# Patient Record
Sex: Male | Born: 1980 | Race: Black or African American | Hispanic: No | Marital: Single | Smoking: Never smoker
Health system: Southern US, Community
[De-identification: ages and names within clinical notes are randomized; demographics above are authoritative.]

## PROBLEM LIST (undated history)

## (undated) HISTORY — PX: NO PAST SURGERIES: SHX2092

---

## 2021-09-15 ENCOUNTER — Encounter (HOSPITAL_COMMUNITY): Payer: Self-pay | Admitting: Emergency Medicine

## 2021-09-15 ENCOUNTER — Emergency Department (HOSPITAL_COMMUNITY)
Admission: EM | Admit: 2021-09-15 | Discharge: 2021-09-15 | Disposition: A | Discharge status: Home / Self Care | Payer: Self-pay | Attending: Emergency Medicine | Admitting: Emergency Medicine

## 2021-09-15 ENCOUNTER — Emergency Department (HOSPITAL_COMMUNITY): Payer: Self-pay

## 2021-09-15 ENCOUNTER — Other Ambulatory Visit: Payer: Self-pay

## 2021-09-15 DIAGNOSIS — K649 Unspecified hemorrhoids: Secondary | ICD-10-CM | POA: Insufficient documentation

## 2021-09-15 DIAGNOSIS — K644 Residual hemorrhoidal skin tags: Secondary | ICD-10-CM

## 2021-09-15 DIAGNOSIS — D696 Thrombocytopenia, unspecified: Secondary | ICD-10-CM

## 2021-09-15 DIAGNOSIS — R7989 Other specified abnormal findings of blood chemistry: Secondary | ICD-10-CM

## 2021-09-15 DIAGNOSIS — R7401 Elevation of levels of liver transaminase levels: Secondary | ICD-10-CM | POA: Insufficient documentation

## 2021-09-15 DIAGNOSIS — K7030 Alcoholic cirrhosis of liver without ascites: Secondary | ICD-10-CM | POA: Insufficient documentation

## 2021-09-15 DIAGNOSIS — K7460 Unspecified cirrhosis of liver: Secondary | ICD-10-CM

## 2021-09-15 DIAGNOSIS — R109 Unspecified abdominal pain: Secondary | ICD-10-CM

## 2021-09-15 DIAGNOSIS — Z20822 Contact with and (suspected) exposure to covid-19: Secondary | ICD-10-CM | POA: Insufficient documentation

## 2021-09-15 LAB — ABO/RH: ABO/RH(D): O POS

## 2021-09-15 LAB — COMPREHENSIVE METABOLIC PANEL
ALT: 371 U/L — ABNORMAL HIGH (ref 0–44)
AST: 317 U/L — ABNORMAL HIGH (ref 15–41)
Albumin: 3.7 g/dL (ref 3.5–5.0)
Alkaline Phosphatase: 133 U/L — ABNORMAL HIGH (ref 38–126)
Anion gap: 6 (ref 5–15)
BUN: 12 mg/dL (ref 6–20)
CO2: 27 mmol/L (ref 22–32)
Calcium: 9.3 mg/dL (ref 8.9–10.3)
Chloride: 103 mmol/L (ref 98–111)
Creatinine, Ser: 1 mg/dL (ref 0.61–1.24)
GFR, Estimated: >60 mL/min (ref >60)
Glucose, Bld: 120 mg/dL — ABNORMAL HIGH (ref 70–99)
Potassium: 4.7 mmol/L (ref 3.5–5.1)
Sodium: 136 mmol/L (ref 135–145)
Total Bilirubin: 1 mg/dL (ref 0.3–1.2)
Total Protein: 7.2 g/dL (ref 6.5–8.1)

## 2021-09-15 LAB — RESP PANEL BY RT-PCR (FLU A&B, COVID) ARPGX2
Influenza A by PCR: NEGATIVE
Influenza B by PCR: NEGATIVE
SARS Coronavirus 2 by RT PCR: NEGATIVE

## 2021-09-15 LAB — CBC
HCT: 44.1 % (ref 39.0–52.0)
Hemoglobin: 14.5 g/dL (ref 13.0–17.0)
MCH: 29.5 pg (ref 26.0–34.0)
MCHC: 32.9 g/dL (ref 30.0–36.0)
MCV: 89.6 fL (ref 80.0–100.0)
Platelets: 105 10*3/uL — ABNORMAL LOW (ref 150–400)
RBC: 4.92 MIL/uL (ref 4.22–5.81)
RDW: 13.1 % (ref 11.5–15.5)
WBC: 4.7 10*3/uL (ref 4.0–10.5)
nRBC: 0.4 % — ABNORMAL HIGH (ref 0.0–0.2)

## 2021-09-15 LAB — TYPE AND SCREEN
ABO/RH(D): O POS
Antibody Screen: NEGATIVE

## 2021-09-15 LAB — LIPASE, BLOOD: Lipase: 89 U/L — ABNORMAL HIGH (ref 11–51)

## 2021-09-15 LAB — POC OCCULT BLOOD, ED: Fecal Occult Bld: NEGATIVE

## 2021-09-15 MED ORDER — HYDROCORTISONE (PERIANAL) 2.5 % EX CREA: 1.0000 "application " | TOPICAL_CREAM | Freq: Two times a day (BID) | CUTANEOUS | 0 refills | Status: AC | PRN

## 2021-09-15 MED ORDER — DOCUSATE SODIUM 100 MG PO CAPS: 100.0000 mg | ORAL_CAPSULE | Freq: Two times a day (BID) | ORAL | 0 refills | Status: AC | PRN

## 2021-09-15 MED ORDER — DICYCLOMINE HCL 20 MG PO TABS: 20.0000 mg | ORAL_TABLET | Freq: Two times a day (BID) | ORAL | 0 refills | Status: AC | PRN

## 2021-09-15 MED ORDER — POLYETHYLENE GLYCOL 3350 17 G PO PACK: 17.0000 g | PACK | Freq: Every day | ORAL | 0 refills | Status: AC | PRN

## 2021-09-15 MED ORDER — IOHEXOL 300 MG/ML  SOLN
100.0000 mL | Freq: Once | INTRAMUSCULAR | Status: AC | PRN
  Administered 2021-09-15: 100 mL via INTRAVENOUS

## 2021-09-15 MED ORDER — SODIUM CHLORIDE 0.9 % IV BOLUS
1000.0000 mL | Freq: Once | INTRAVENOUS | Status: AC
  Administered 2021-09-15: 1000 mL via INTRAVENOUS

## 2021-09-15 NOTE — ED Notes (Signed)
Pt transported to Ultrasound.  

## 2021-09-15 NOTE — Discharge Instructions (Addendum)
You were seen in the emergency department today for abdominal pain and blood in your stool.  Your labs and imaging showed findings of cirrhosis as well as gallstones.  It is very important that you follow-up with a GI doctor regarding your cirrhosis, with the cirrhosis your liver function tests are elevated and your platelet count is low.  Getting up findings of cirrhosis do not drink alcohol and avoid tylenol.   We have sent a referral to the GI doctor to help facilitate this and provided their information in your follow-up instructions.  If you have gallstones we have also provided information for general surgery follow-up.  We are sending you home with the following medicines: - Bentyl: Take every 12 hours as needed for abdominal pain/spasms - MiraLAX: Take daily as needed for constipation/abdominal pain - Colace: Take every 12 hours as needed for constipation/abdominal pain.  - Anusol: Use 1 suppository every 12 hours as needed for rectal pain/itching with your hemorrhoid.   We have prescribed you new medication(s) today. Discuss the medications prescribed today with your pharmacist as they can have adverse effects and interactions with your other medicines including over the counter and prescribed medications. Seek medical evaluation if you start to experience new or abnormal symptoms after taking one of these medicines, seek care immediately if you start to experience difficulty breathing, feeling of your throat closing, facial swelling, or rash as these could be indications of a more serious allergic reaction  Please call the community health and wellness center with [place] to establish primary care in the area.  Please call to follow-up with the specialist provided.  Return to the emergency department for new or worsening symptoms including but not limited to new or worsening pain, inability to keep fluids down, fever, dark/tarry stool, lightheadedness, dizziness, passing out, or any  other concerns.   English to [place] Google Translate Vous Psychiatrist t vu aux urgences aujourd'hui pour des douleurs abdominales et du sang dans vos selles. Vos labos et votre imagerie [employer] des dcouvertes de cirrhose ainsi que des calculs biliaires.  Il est trs important que vous fassiez un suivi avec un mdecin gastro-intestinal au sujet de votre cirrhose, avec la cirrhose, vos tests de la fonction hpatique sont levs et votre nombre de plaquettes est [place].  Se lever dcouvertes de cirrhose ne pas boire d'alcool et Music therapist.  Nous avons envoy une rfrence au mdecin gastro-intestinal pour faciliter cela et avons fourni ses informations dans vos instructions de suivi.  Si vous avez des calculs biliaires, nous avons galement fourni des informations [employer] suivi de la chirurgie gnrale.  Nous vous renvoyons chez vous avec les mdicaments suivants : - Bentyl : Prendre toutes les 12 heures au besoin pour les douleurs/spasmes abdominaux - MiraLAX : Prendre quotidiennement au besoin en cas de constipation/douleurs abdominales - Colace : Prendre toutes les 12 heures au besoin en cas de constipation/douleurs abdominales. - Anusol : Utilisez 1 suppositoire toutes les 12 heures au besoin pour les douleurs/dmangeaisons rectales avec votre hmorrode.   Nous vous avons prescrit de nouveaux mdicaments aujourd'hui. Discutez des [employer] prescrits aujourd'hui Psychologist, forensic car ils peuvent avoir des effets indsirables et des interactions avec vos autres mdicaments, y compris les mdicaments en vente libre et les mdicaments prescrits. Consultez un mdecin si vous commencez  ressentir des [employer] ou anormaux aprs avoir pris l'un de ces mdicaments, consultez immdiatement si vous commencez  ressentir des difficults respiratoires, une sensation de fermeture de la gorge,  un gonflement du visage ou une ruption cutane, car ceux-ci pourraient tre des indications  [employer] plus grave. raction allergique  Veuillez appeler le centre de sant et de bien-tre communautaire avec [place] pour tablir des soins primaires dans la rgion. Veuillez appeler pour un suivi avec le spcialiste fourni. Retournez au service des urgences pour des symptmes nouveaux ou qui s'aggravent, y compris, mais sans s'y limiter, [employer] nouvelle ou qui s'aggrave, une incapacit  retenir les liquides, de la fivre, des selles sombres / goudronneuses, des tourdissements, des tourdissements, des vanouissements ou toute autre proccupation.

## 2021-09-15 NOTE — ED Triage Notes (Signed)
Pt reported to ED with c/o generalized abdominal pain, rectal bleeding and lack of appetite for three days. Pt states he has recently arrived from [place] two weeks ago. Denies any urinary symptoms, nausea, vomiting or diarrhea.Denies any previous medical conditions. Reports blood in stool as "liquid blood".

## 2021-09-15 NOTE — ED Provider Notes (Signed)
[facility] EMERGENCY DEPARTMENT Provider Note   CSN: [id number] Arrival date & time: 09/15/21  0108     History  Chief Complaint  Patient presents with   Abdominal Pain   Rectal Bleeding    Thomas Martin is a 41 y.o. male without significant pmhx who presents to the ED with complaints of abdominal pain x 3 days. Patient reports generalized abdominal pain, intermittently, lasts for 30 minutes at a time. Worse prior to BM, alleviated some with a bowel movement.  Has been having bright red blood on the toilet paper after Bms, stool is brown. Denies fever, chills, headache, URI sxs, vomiting, melena, dysuria, testicular pain/swelling, or rashes. Patient was living in [place], recently moved to the [place] 2 weeks ago, sxs started 3 days ago, no known exposure to illnesses or sick contacts with similar. Patient's sister supplements history. Denies alcohol or tylenol use   Interpretor utilized throughout encounter.    HPI     Home Medications Prior to Admission medications   Not on File      Allergies    Patient has no known allergies.    Review of Systems   Review of Systems  Constitutional:  Negative for chills and fever.  HENT:  Negative for congestion, ear pain and sore throat.   Respiratory:  Negative for cough and shortness of breath.   Cardiovascular:  Negative for chest pain.  Gastrointestinal:  Positive for abdominal pain, anal bleeding and constipation. Negative for diarrhea, nausea and vomiting.  Genitourinary:  Negative for dysuria, scrotal swelling and testicular pain.  Skin:  Negative for rash.  All other systems reviewed and are negative.  Physical Exam Updated Vital Signs BP 118/78    Pulse (!) 57    Temp 98.4 F (36.9 C) (Oral)    Resp (!) 23    SpO2 97%  Physical Exam Vitals and nursing note reviewed. Exam conducted with a chaperone present.  Constitutional:      General: He is not in acute distress.    Appearance: He is  well-developed. He is not toxic-appearing.  HENT:     Head: Normocephalic and atraumatic.  Eyes:     General:        Right eye: No discharge.        Left eye: No discharge.     Conjunctiva/sclera: Conjunctivae normal.  Cardiovascular:     Rate and Rhythm: Normal rate and regular rhythm.  Pulmonary:     Effort: Pulmonary effort is normal. No respiratory distress.     Breath sounds: Normal breath sounds. No wheezing, rhonchi or rales.  Abdominal:     General: There is no distension.     Palpations: Abdomen is soft.     Tenderness: There is abdominal tenderness in the right upper quadrant, epigastric area, periumbilical area and left lower quadrant. There is no guarding or rebound. Negative signs include Murphy's sign and McBurney's sign.  Genitourinary:    Comments: NT Jackelyn Hoehn present as chaperone.  External non thrombosed hemorrhoid present.  DRE with soft brown stool, no melena, no bright red blood. Fecal occult testing negative.  Musculoskeletal:     Cervical back: Neck supple.  Skin:    General: Skin is warm and dry.     Findings: No rash.  Neurological:     Mental Status: He is alert.     Comments: Clear speech.   Psychiatric:        Behavior: Behavior normal.    ED Results /  Procedures / Treatments   Labs (all labs ordered are listed, but only abnormal results are displayed) Labs Reviewed  COMPREHENSIVE METABOLIC PANEL - Abnormal; Notable for the following components:      Result Value   Glucose, Bld 120 (*)    AST 317 (*)    ALT 371 (*)    Alkaline Phosphatase 133 (*)    All other components within normal limits  CBC - Abnormal; Notable for the following components:   Platelets 105 (*)    nRBC 0.4 (*)    All other components within normal limits  LIPASE, BLOOD - Abnormal; Notable for the following components:   Lipase 89 (*)    All other components within normal limits  POC OCCULT BLOOD, ED  TYPE AND SCREEN  ABO/RH    EKG EKG  Interpretation  Date/Time:  Monday September 15 2021 01:40:34 EST Ventricular Rate:  66 PR Interval:  178 QRS Duration: 92 QT Interval:  372 QTC Calculation: 389 R Axis:   49 Text Interpretation: Normal sinus rhythm Normal ECG No previous ECGs available Confirmed by Drema Pry [phone]) on 09/15/2021 1:44:45 AM  Radiology CT Abdomen Pelvis W Contrast  Result Date: 09/15/2021 CLINICAL DATA:  Acute, nonlocalized abdominal pain EXAM: CT ABDOMEN AND PELVIS WITH CONTRAST TECHNIQUE: Multidetector CT imaging of the abdomen and pelvis was performed using the standard protocol following bolus administration of intravenous contrast. RADIATION DOSE REDUCTION: This exam was performed according to the departmental dose-optimization program which includes automated exposure control, adjustment of the mA and/or kV according to patient size and/or use of iterative reconstruction technique. CONTRAST:  OMNIPAQUE IOHEXOL 300 MG/ML  SOLN COMPARISON:  Abdominal ultrasound from earlier today FINDINGS: Lower chest:  No contributory findings. Hepatobiliary: Lobulated liver surface with large fissures and caudate lobe, consistent with cirrhosis. No noted mass.Cholelithiasis. No visible gallbladder edema and no gallbladder over distension. Pancreas: Unremarkable. Spleen: Mildly enlarged at 14 cm craniocaudal, presumably related to the cirrhotic liver morphology. Adrenals/Urinary Tract: Negative adrenals. No hydronephrosis or stone. Small area of calcification and presumed scarring at the upper pole left kidney. Unremarkable bladder. Stomach/Bowel:  No obstruction. No appendicitis. Vascular/Lymphatic: No acute vascular abnormality. No mass or adenopathy. Reproductive:No pathologic findings. Other: No ascites or pneumoperitoneum. Small fatty umbilical hernia. Musculoskeletal: No acute abnormalities. IMPRESSION: 1. No acute finding. 2. Cirrhosis. 3. Cholelithiasis. Electronically Signed   By: Tiburcio Pea M.D.   On:  09/15/2021 05:47   US Abdomen Limited RUQ (LIVER/GB)  Result Date: 09/15/2021 CLINICAL DATA:  Abdominal pain with elevated liver enzymes. EXAM: ULTRASOUND ABDOMEN LIMITED RIGHT UPPER QUADRANT COMPARISON:  None. FINDINGS: Gallbladder: There are layering stones in the gallbladder up to 7 mm in size. There is thickening of the free wall which measures 4.4 mm but there is no positive sonographic Murphy's sign or pericholecystic fluid. The gallbladder is partially contracted. Common bile duct: Diameter: 4.6 mm with no intrahepatic biliary prominence. Liver: No focal lesion identified. There is increased hepatic echogenicity consistent with steatosis. There is capsular nodularity in the left lobe suggesting cirrhosis. Portal vein is patent on color Doppler imaging with normal direction of blood flow towards the liver. There is a prominent hepatic portal main vein which measures 16 mm. Other: There is no ascites. IMPRESSION: 1. Cholelithiasis with gallbladder thickening without pericholecystic fluid or sonographic Murphy's sign. 2. Differential diagnosis includes simple nondistention, chronic cholecystitis, pain medicated acute cholecystitis, and congestive and reactive wall thickening. Please correlate clinically. 3. Increased hepatic echogenicity of steatosis with capsular nodularity in  the left lobe consistent with cirrhosis. No ascites. 4. Prominent portal main vein without flow reversal. Electronically Signed   By: Almira BarKeith  Chesser M.D.   On: 09/15/2021 05:33    Procedures Procedures    Medications Ordered in ED Medications - No data to display  ED Course/ Medical Decision Making/ A&P                           Medical Decision Making Amount and/or Complexity of Data Reviewed Labs: ordered. Radiology: ordered.  Risk Prescription drug management.   Patient presents to the ED with complaints of abdominal pain, this involves an extensive number of treatment options, and is a complaint that carries  with it a high risk of complications and morbidity. Nontoxic, vitals without significant abnormality. Epigastric, RUQ, and LLQ abdominal tenderness without peritoneal signs. DRE with soft brown stool- fecal occult negative, hemorrhoid present- likely cause of BRBPR.   Additional history obtained:  Patient's sister.   EKG: NSR  Lab Tests:  I reviewed & interpreted labs including:  CBC: thrombocytopenia.  CMP: Elevated LFTs. T bili WNL.  Lipase: mildly elevated.  Fecal occult: negative.   Imaging Studies ordered:  I ordered and viewed the following imaging, agree with radiologist impression:  RUQ US: 1. Cholelithiasis with gallbladder thickening without pericholecystic fluid or sonographic Murphy's sign. 2. Differential diagnosis includes simple nondistention, chronic cholecystitis, pain medicated acute cholecystitis, and congestive and reactive wall thickening. Please correlate clinically. 3. Increased hepatic echogenicity of steatosis with capsular nodularity in the left lobe consistent with cirrhosis. No ascites. 4. Prominent portal main vein without flow reversal CT A/P:  1. No acute finding. 2. Cirrhosis. 3. Cholelithiasis.    Patient with cirrhosis and gallstones on imaging. Given gallbladder findings on US will discuss w/ general surgery.   06:00: CONSULT: Discussed with general surgeon Dr. Freida BusmanAllen- relayed often can have gallbladder thickening with cirrhosis, based on presentation feels less likely to be biliary colic in nature, recommend patient will need further work up of his cirrhosis.   On re- assessment patient is pain-free, tolerating p.o, he seems appropriate for discharge. He will need close GI follow-up for his cirrhosis.  We will also provide general surgery follow-up given his gallstones.  Information provided for the [place] [facility] to establish primary care.  We will start on bowel regimen as I do suspect that constipation is likely playing a large  factor in his pain based on the symptoms he is describing, will provide bentyl for biliary colic per discussion w/ attending, and anusol for hemorrhoids. His rectal bleeding does seem secondary to a hemorrhoid which is non-thrombosed, no active bleeding on exam, additionally no melena, fecal occult negative.  Patient appears appropriate for discharge home at this time with close outpatient follow-up and strict ED return precautions.  Ambulatory referral sent to gastroenterology to help facilitate follow-up.  I discussed results, treatment plan, need for follow-up, and return precautions with the patient and his sister. Provided opportunity for questions, patient and his sister confirmed understanding and are in agreement with plan.   Findings and plan of care discussed with supervising physician Dr. Nicanor AlconPalumbo who is in agreement.   Portions of this note were generated with Scientist, clinical (histocompatibility and immunogenetics)Dragon dictation software. Dictation errors may occur despite best attempts at proofreading.          Final Clinical Impression(s) / ED Diagnoses Final diagnoses:  Elevated LFTs  Abdominal pain  Cirrhosis of liver without ascites, unspecified  hepatic cirrhosis type (HCC)  Thrombocytopenia (HCC)  External hemorrhoid    Rx / DC Orders ED Discharge Orders          Ordered    polyethylene glycol (MIRALAX) 17 g packet  Daily PRN        09/15/21 0633    docusate sodium (COLACE) 100 MG capsule  2 times daily PRN        09/15/21 0633    dicyclomine (BENTYL) 20 MG tablet  2 times daily PRN        09/15/21 0633    hydrocortisone (ANUSOL-HC) 2.5 % rectal cream  2 times daily PRN        09/15/21 0633    Ambulatory referral to Gastroenterology          09/15/21 [id number]              Cherly Anderson, PA-C 09/15/21 [id number]    Palumbo, April, MD 09/15/21 [phone]

## 2021-10-06 ENCOUNTER — Ambulatory Visit (INDEPENDENT_AMBULATORY_CARE_PROVIDER_SITE_OTHER): Payer: Self-pay | Admitting: Gastroenterology

## 2021-10-06 ENCOUNTER — Encounter: Payer: Self-pay | Admitting: Gastroenterology

## 2021-10-06 ENCOUNTER — Other Ambulatory Visit (INDEPENDENT_AMBULATORY_CARE_PROVIDER_SITE_OTHER): Payer: Self-pay

## 2021-10-06 VITALS — BP 114/76 | HR 62 | Ht 71.0 in | Wt 208.0 lb

## 2021-10-06 DIAGNOSIS — K5900 Constipation, unspecified: Secondary | ICD-10-CM

## 2021-10-06 DIAGNOSIS — K8020 Calculus of gallbladder without cholecystitis without obstruction: Secondary | ICD-10-CM

## 2021-10-06 DIAGNOSIS — K7460 Unspecified cirrhosis of liver: Secondary | ICD-10-CM

## 2021-10-06 DIAGNOSIS — K625 Hemorrhage of anus and rectum: Secondary | ICD-10-CM

## 2021-10-06 LAB — PROTIME-INR
INR: 1.2 ratio — ABNORMAL HIGH (ref 0.8–1.0)
Prothrombin Time: 12.8 s (ref 9.6–13.1)

## 2021-10-06 LAB — IBC + FERRITIN
Ferritin: 571.8 ng/mL — ABNORMAL HIGH (ref 22.0–322.0)
Iron: 255 ug/dL — ABNORMAL HIGH (ref 42–165)
Saturation Ratios: 88.9 % — ABNORMAL HIGH (ref 20.0–50.0)
TIBC: 287 ug/dL (ref 250.0–450.0)
Transferrin: 205 mg/dL — ABNORMAL LOW (ref 212.0–360.0)

## 2021-10-06 LAB — COMPREHENSIVE METABOLIC PANEL
ALT: 283 U/L — ABNORMAL HIGH (ref 0–53)
AST: 302 U/L — ABNORMAL HIGH (ref 0–37)
Albumin: 4 g/dL (ref 3.5–5.2)
Alkaline Phosphatase: 185 U/L — ABNORMAL HIGH (ref 39–117)
BUN: 10 mg/dL (ref 6–23)
CO2: 28 mEq/L (ref 19–32)
Calcium: 9.8 mg/dL (ref 8.4–10.5)
Chloride: 100 mEq/L (ref 96–112)
Creatinine, Ser: 0.76 mg/dL (ref 0.40–1.50)
GFR: 112.3 mL/min (ref >60.00)
Glucose, Bld: 122 mg/dL — ABNORMAL HIGH (ref 70–99)
Potassium: 4.5 mEq/L (ref 3.5–5.1)
Sodium: 134 mEq/L — ABNORMAL LOW (ref 135–145)
Total Bilirubin: 2 mg/dL — ABNORMAL HIGH (ref 0.2–1.2)
Total Protein: 8.2 g/dL (ref 6.0–8.3)

## 2021-10-06 NOTE — Progress Notes (Signed)
? ? ?[facility] Consult Note: ? ?History: ?Thomas Martin ?10/06/2021 ? ?Referring provider: Zacarias Pontes, ED, patient has no primary care provider. ? ?Reason for consult/chief complaint: New Dx cirrhosis ? ?Subjective  ?HPI: ? ?This is a 41 year old man from [place] referred to [place] after recent ED visit. ? ?From the impression and plan of ED physician note dated 09/15/2021: ?"Patient with cirrhosis and gallstones on imaging. Given gallbladder findings on [place] will discuss w/ general surgery.  ?  ?06:00: CONSULT: Discussed with general surgeon Dr. Zenia Resides- relayed often can have gallbladder thickening with cirrhosis, based on presentation feels less likely to be biliary colic in nature, recommend patient will need further work up of his cirrhosis.  ?  ?On re- assessment patient is pain-free, tolerating p.o, he seems appropriate for discharge. He will need close GI follow-up for his cirrhosis.  We will also provide general surgery follow-up given his gallstones.  Information provided for the [facility] to establish primary care.  We will start on bowel regimen as I do suspect that constipation is likely playing a large factor in his pain based on the symptoms he is describing, will provide bentyl for biliary colic per discussion w/ attending, and anusol for hemorrhoids. His rectal bleeding does seem secondary to a hemorrhoid which is non-thrombosed, no active bleeding on exam, additionally no melena, fecal occult negative.  Patient appears appropriate for discharge home at this time with close outpatient follow-up and strict ED return precautions.  Ambulatory referral sent to gastroenterology to help facilitate follow-up." ? ?____________________ ? ?Thomas Martin was here with his sister, who speaks [place] and acts as Optometrist. ?He reported that his constipation is improved on MiraLAX, and he felt that was the reason he had been referred to see [place] today.  They both seem to have no  knowledge of his cirrhosis diagnosis (though it is included on his ED paperwork which is also translated to [place]). ?He has limited health literacy, said he is not known to have any chronic medical problems, though it is not clear he always gets regular medical care. ?He arrived from [place] 5 to 6 weeks ago to visit his sister and was feeling well until he had a few days of bloating and crampy abdominal pain with constipation followed by painless rectal bleeding for a few days.  The bleeding has stopped and his constipation relieved on MiraLAX. ?He has no known history of liver disease, they know little or nothing about their family medical history except that her mother has diabetes. ? ?ROS: ? ?Review of Systems  ?Constitutional:  Negative for appetite change and unexpected weight change.  ?HENT:  Negative for mouth sores and voice change.   ?Eyes:  Negative for pain and redness.  ?Respiratory:  Negative for cough and shortness of breath.   ?Cardiovascular:  Negative for chest pain and palpitations.  ?Genitourinary:  Negative for dysuria and hematuria.  ?Musculoskeletal:  Negative for arthralgias and myalgias.  ?Skin:  Negative for pallor and rash.  ?Neurological:  Negative for weakness and headaches.  ?Hematological:  Negative for adenopathy.  ? ? ?Past Medical History: ?History reviewed. No pertinent past medical history. ? ? ?Past Surgical History: ?Past Surgical History:  ?Procedure Laterality Date  ? NO PAST SURGERIES    ? ? ? ?Family History: ?Family History  ?Problem Relation Age of Onset  ? Stomach cancer Neg Hx   ? Colon cancer Neg Hx   ? Esophageal cancer Neg Hx   ? Pancreatic cancer  Neg Hx   ? ? ?Social History: ?Social History  ? ?Socioeconomic History  ? Marital status: Single  ?  Spouse name: Not on file  ? Number of children: Not on file  ? Years of education: Not on file  ? Highest education level: Not on file  ?Occupational History  ? Not on file  ?Tobacco Use  ? Smoking status: Never  ? Smokeless  tobacco: Never  ?Vaping Use  ? Vaping Use: Not on file  ?Substance and Sexual Activity  ? Alcohol use: Not Currently  ? Drug use: Never  ? Sexual activity: Yes  ?Other Topics Concern  ? Not on file  ?Social History Narrative  ? Not on file  ? ?Social Determinants of Health  ? ?Financial Resource Strain: Not on file  ?Food Insecurity: Not on file  ?Transportation Needs: Not on file  ?Physical Activity: Not on file  ?Stress: Not on file  ?Social Connections: Not on file  ? 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 ?Allergies: ?No Known Allergies ? ?Outpatient Meds: ?Current Outpatient Medications  ?Medication Sig Dispense Refill  ? dicyclomine (BENTYL) 20 MG tablet Take 1 tablet (20 mg total) by mouth 2 (two) times daily as needed for spasms (abdominal pain). 15 tablet 0  ? docusate sodium (COLACE) 100 MG capsule Take 1 capsule (100 mg total) by mouth 2 (two) times daily as needed for mild constipation or moderate constipation. 30 capsule 0  ? hydrocortisone (ANUSOL-HC) 2.5 % rectal cream Place 1 application rectally 2 (two) times daily as needed for hemorrhoids or anal itching. 30 g 0  ? polyethylene glycol (MIRALAX) 17 g packet Take 17 g by mouth daily as needed for moderate constipation or mild constipation. 15 each 0  ? ?No current facility-administered medications for this visit.  ? ? ? ? ?___________________________________________________________________ ?Objective  ? ?Exam: ? ?Ht 5\' 11"  (1.803 m)   Wt 208 lb (94.3 kg)   BMI 29.01 kg/m?  ?Wt Readings from Last 3 Encounters:  ?10/06/21 208 lb (94.3 kg)  ? ? ?General: He is alert and conversational and well-appearing. ?Eyes: sclera anicteric, no redness ?ENT: oral mucosa moist without lesions, no cervical or supraclavicular lymphadenopathy ?CV: RRR without murmur, S1/S2, no JVD, no peripheral edema ?Resp: clear to auscultation bilaterally, normal RR and effort noted ?GI: soft, no tenderness, with active bowel sounds.  No palpable enlargement  of the liver or spleen.  No bulging flanks or caput medusa ?Skin; warm and dry, no rash or jaundice noted ?Neuro: awake, alert and oriented x 3. Normal gross motor function and fluent speech ?His sister reports that his mental status has been normal lately.  He speaks a little bit of English and speech is fluent. ? ?Labs: ? ?CBC Latest Ref Rng & Units 09/15/2021  ?WBC 4.0 - 10.5 K/uL 4.7  ?Hemoglobin 13.0 - 17.0 g/dL 14.5  ?Hematocrit 39.0 - 52.0 % 44.1  ?Platelets 150 - 400 K/uL 105(L)  ? ?CMP Latest Ref Rng & Units 09/15/2021  ?Glucose 70 - 99 mg/dL 120(H)  ?BUN 6 - 20 mg/dL 12  ?Creatinine 0.61 - 1.24 mg/dL 1.00  ?Sodium 135 - 145 mmol/L 136  ?Potassium 3.5 - 5.1 mmol/L 4.7  ?Chloride 98 - 111 mmol/L 103  ?CO2 22 - 32 mmol/L 27  ?Calcium 8.9 - 10.3 mg/dL 9.3  ?Total Protein 6.5 - 8.1 g/dL 7.2  ?Total Bilirubin 0.3 - 1.2 mg/dL 1.0  ?Alkaline Phos 38 - 126 U/L 133(H)  ?  AST 15 - 41 U/L 317(H)  ?ALT 0 - 44 U/L 371(H)  ? ? ? ?Radiologic Studies: ? ?CLINICAL DATA:  Acute, nonlocalized abdominal pain ?  ?EXAM: ?CT ABDOMEN AND PELVIS WITH CONTRAST ?  ?TECHNIQUE: ?Multidetector CT imaging of the abdomen and pelvis was performed ?using the standard protocol following bolus administration of ?intravenous contrast. ?  ?RADIATION DOSE REDUCTION: This exam was performed according to the ?departmental dose-optimization program which includes automated ?exposure control, adjustment of the mA and/or kV according to ?patient size and/or use of iterative reconstruction technique. ?  ?CONTRAST:  184mL OMNIPAQUE IOHEXOL 300 MG/ML  SOLN ?  ?COMPARISON:  Abdominal ultrasound from earlier today ?  ?FINDINGS: ?Lower chest:  No contributory findings. ?  ?Hepatobiliary: Lobulated liver surface with large fissures and ?caudate lobe, consistent with cirrhosis. No noted ?mass.Cholelithiasis. No visible gallbladder edema and no gallbladder ?over distension. ?  ?Pancreas: Unremarkable. ?  ?Spleen: Mildly enlarged at 14 cm craniocaudal, presumably  related to ?the cirrhotic liver morphology. ?  ?Adrenals/Urinary Tract: Negative adrenals. No hydronephrosis or ?stone. Small area of calcification and presumed scarring at the ?upper pole left kidne

## 2021-10-06 NOTE — Patient Instructions (Signed)
If you are age 41 or older, your body mass index should be between 23-30. Your Body mass index is 29.01 kg/m?Marland Kitchen If this is out of the aforementioned range listed, please consider follow up with your Primary Care Provider. ? ?If you are age 49 or younger, your body mass index should be between 19-25. Your Body mass index is 29.01 kg/m?Marland Kitchen If this is out of the aformentioned range listed, please consider follow up with your Primary Care Provider.  ? ?________________________________________________________ ? ?The [facility] providers would like to encourage you to use [facility] to communicate with providers for non-urgent requests or questions.  Due to long hold times on the telephone, sending your provider a message by [facility] may be a faster and more efficient way to get a response.  Please allow 48 business hours for a response.  Please remember that this is for non-urgent requests.  ?_______________________________________________________ ?Your provider has requested that you go to the basement level for lab work before leaving today. Press "B" on the elevator. The lab is located at the first door on the left as you exit the elevator. ? ?Due to recent changes in healthcare laws, you may see the results of your imaging and laboratory studies on MyChart before your provider has had a chance to review them.  We understand that in some cases there may be results that are confusing or concerning to you. Not all laboratory results come back in the same time frame and the provider may be waiting for multiple results in order to interpret others.  Please give [place] 48 hours in order for your provider to thoroughly review all the results before contacting the office for clarification of your results.   ? ?It was a pleasure to see you today! ? ?Thank you for trusting me with your gastrointestinal care!   ?  ?

## 2021-10-08 ENCOUNTER — Other Ambulatory Visit: Payer: Self-pay

## 2021-10-08 DIAGNOSIS — B181 Chronic viral hepatitis B without delta-agent: Secondary | ICD-10-CM

## 2021-10-08 DIAGNOSIS — K7460 Unspecified cirrhosis of liver: Secondary | ICD-10-CM

## 2021-10-09 LAB — HEPATITIS A ANTIBODY, TOTAL: Hepatitis A AB,Total: REACTIVE — AB

## 2021-10-09 LAB — HEPATITIS C ANTIBODY
Hepatitis C Ab: NONREACTIVE
SIGNAL TO CUT-OFF: 0.39 (ref <1.00)

## 2021-10-09 LAB — HEPATITIS B SURFACE ANTIGEN: Hepatitis B Surface Ag: REACTIVE — AB

## 2021-10-09 LAB — MITOCHONDRIAL ANTIBODIES: Mitochondrial M2 Ab, IgG: <=20 U (ref <=20.0)

## 2021-10-09 LAB — ANA: Anti Nuclear Antibody (ANA): NEGATIVE

## 2021-10-09 LAB — AFP TUMOR MARKER: AFP-Tumor Marker: 38.1 ng/mL — ABNORMAL HIGH (ref <6.1)

## 2021-10-09 LAB — CERULOPLASMIN: Ceruloplasmin: 26 mg/dL (ref 18–36)

## 2021-10-09 LAB — HEPATITIS B SURFACE ANTIBODY,QUALITATIVE: Hep B S Ab: NONREACTIVE

## 2021-10-09 LAB — ALPHA-1-ANTITRYPSIN: A-1 Antitrypsin, Ser: 188 mg/dL (ref 83–199)

## 2021-10-09 LAB — HIV ANTIBODY (ROUTINE TESTING W REFLEX): HIV 1&2 Ab, 4th Generation: NONREACTIVE

## 2021-10-09 LAB — ANTI-SMOOTH MUSCLE ANTIBODY, IGG: Actin (Smooth Muscle) Antibody (IGG): 21 U — ABNORMAL HIGH (ref <20)

## 2021-10-09 LAB — HEPATITIS B CORE ANTIBODY, TOTAL: Hep B Core Total Ab: REACTIVE — AB

## 2021-10-15 ENCOUNTER — Other Ambulatory Visit: Payer: Self-pay

## 2021-10-15 DIAGNOSIS — R772 Abnormality of alphafetoprotein: Secondary | ICD-10-CM

## 2021-10-15 DIAGNOSIS — B181 Chronic viral hepatitis B without delta-agent: Secondary | ICD-10-CM

## 2021-10-15 DIAGNOSIS — K7460 Unspecified cirrhosis of liver: Secondary | ICD-10-CM

## 2021-11-03 ENCOUNTER — Ambulatory Visit (HOSPITAL_COMMUNITY)
Admission: RE | Admit: 2021-11-03 | Discharge: 2021-11-03 | Disposition: A | Discharge status: Home / Self Care | Payer: Self-pay | Source: Ambulatory Visit | Attending: Gastroenterology | Admitting: Gastroenterology

## 2021-11-03 DIAGNOSIS — K7460 Unspecified cirrhosis of liver: Secondary | ICD-10-CM | POA: Insufficient documentation

## 2021-11-03 DIAGNOSIS — B181 Chronic viral hepatitis B without delta-agent: Secondary | ICD-10-CM | POA: Insufficient documentation

## 2021-11-03 DIAGNOSIS — R772 Abnormality of alphafetoprotein: Secondary | ICD-10-CM | POA: Insufficient documentation

## 2021-11-03 MED ORDER — GADOBUTROL 1 MMOL/ML IV SOLN
9.0000 mL | Freq: Once | INTRAVENOUS | Status: AC | PRN
  Administered 2021-11-03: 9 mL via INTRAVENOUS

## 2021-11-07 ENCOUNTER — Other Ambulatory Visit: Payer: Self-pay

## 2021-11-07 DIAGNOSIS — B181 Chronic viral hepatitis B without delta-agent: Secondary | ICD-10-CM

## 2021-11-07 DIAGNOSIS — K7460 Unspecified cirrhosis of liver: Secondary | ICD-10-CM

## 2021-12-03 ENCOUNTER — Telehealth: Payer: Self-pay | Admitting: Gastroenterology

## 2021-12-03 ENCOUNTER — Encounter: Payer: Self-pay | Admitting: Gastroenterology

## 2021-12-03 NOTE — Telephone Encounter (Signed)
Called pt-- NO answer-- L/M on cell phone to have patient call me back to let us know if he is coming in or needs to reschedule. ?

## 2023-03-31 IMAGING — MR MR ABDOMEN WO/W CM
10 of 18 series · 23 of 48 positions shown · IV contrast (10 GAD)
Comparison: CT September 15, 2021

CLINICAL DATA: Abdominal pain, history of cirrhosis with elevated
AFP, hepatitis B infection

EXAM:
MRI ABDOMEN WITHOUT AND WITH CONTRAST
TECHNIQUE: Multiplanar multisequence MR imaging of the abdomen was performed
both before and after the administration of intravenous contrast.
CONTRAST:  9mL GADAVIST GADOBUTROL 1 MMOL/ML IV SOLN

[Series 3: cor ssfse nav · coronal · 6.0mm · 0.78mm/px · 2 of 41 slices shown]
[im 1/41]
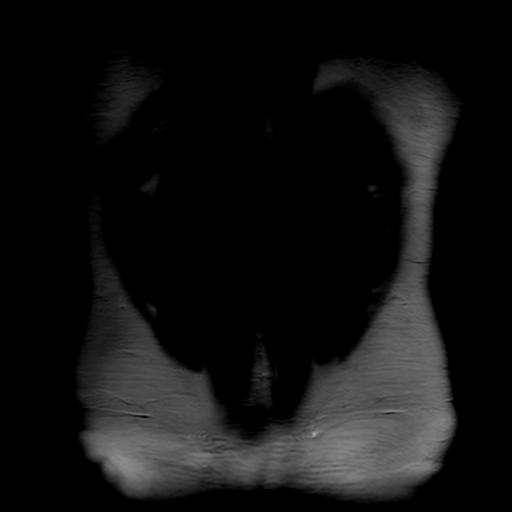
[im 41/41]
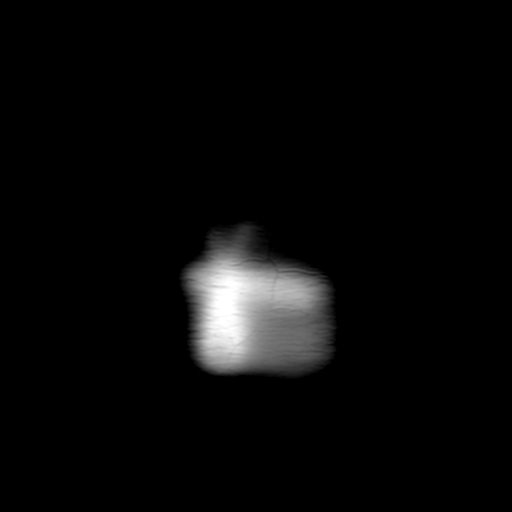

[Series 4: ax ssfse nav · axial · 6.0mm · 0.74mm/px · z∈[-80,+178]mm · 2 of 44 slices shown]
[im 1/44]
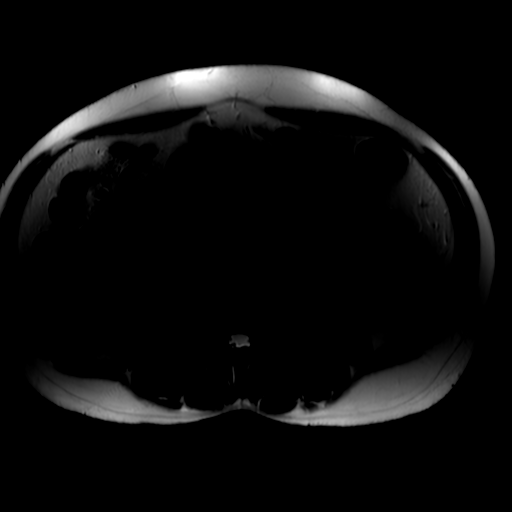
[im 44/44]
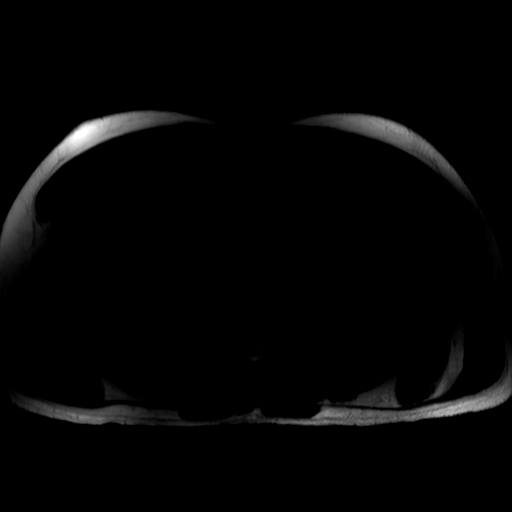

[Series 5: T2 fat-sat · axial · 6.0mm · 0.74mm/px · z∈[-79,+179]mm · 2 of 40 slices shown]
[im 1/40]
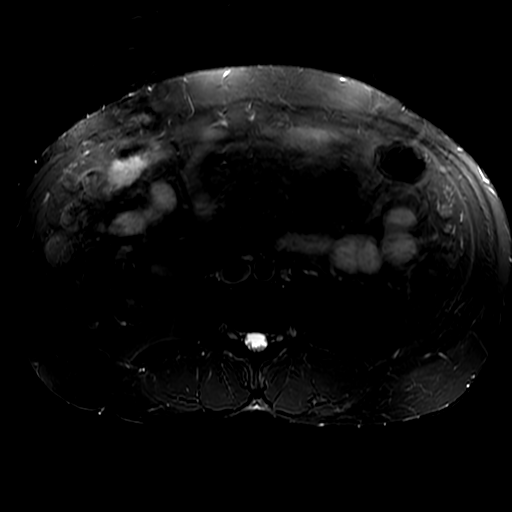
[im 40/40]
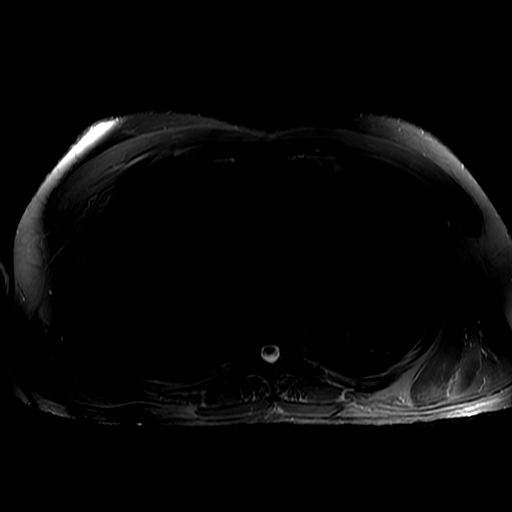

[Series 6: DWI b500 · axial · 8.0mm · 1.48mm/px · 1 of 52 slices shown]
[im 1/52]
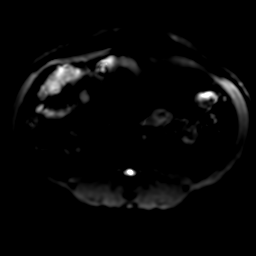

[Series 11: T1 dynamic · coronal · 3.4mm · 1.56mm/px · 4 of 146 slices shown]
[im 1/146]
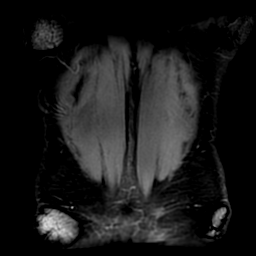
[im 49/146]
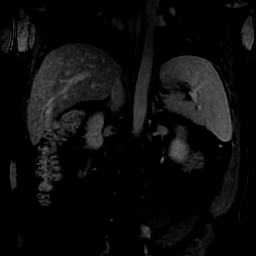
[im 97/146]
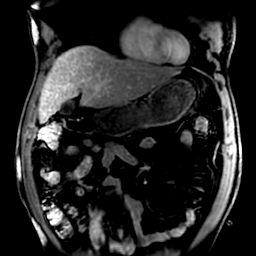
[im 146/146]
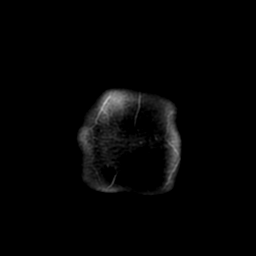

[Series 650: ADC · axial · 8.0mm · 1.48mm/px · 1 of 26 slices shown]
[im 1/26]
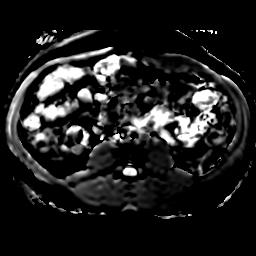

[Series 1000: T1 dynamic post-contrast · axial · non-contrast · 4.0mm · 0.86mm/px · z∈[-68,+162]mm · 3 of 116 slices shown (1 of 4)]
[im 1/116]
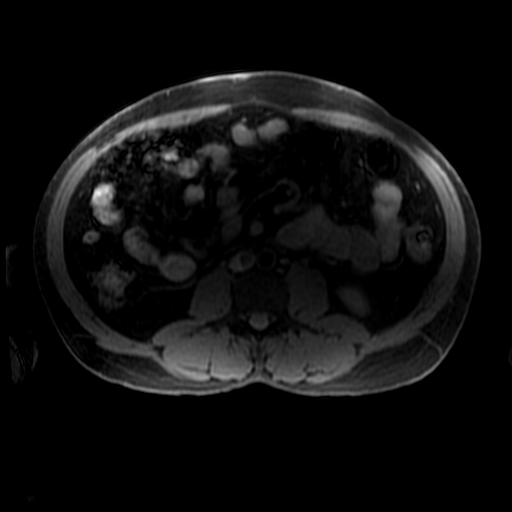
[im 58/116]
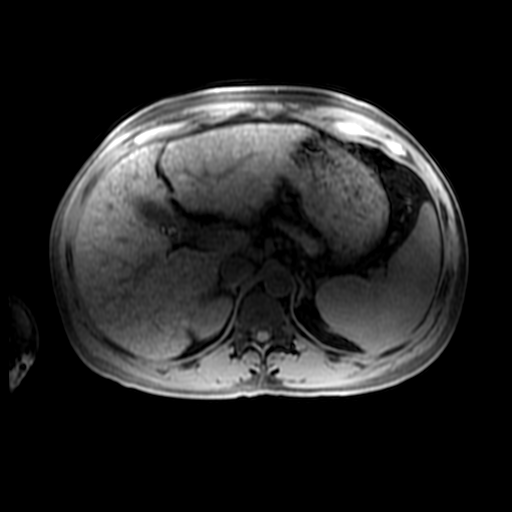
[im 116/116]
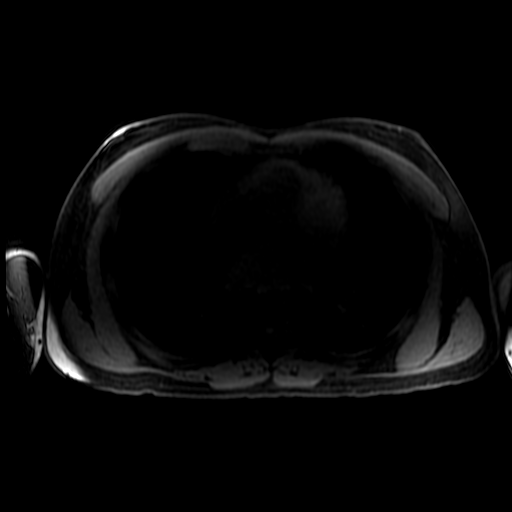

[Series 1001: T1 dynamic post-contrast · axial · non-contrast · 4.0mm · 0.86mm/px · z∈[-68,+162]mm · 3 of 116 slices shown (2 of 4)]
[im 1/116]
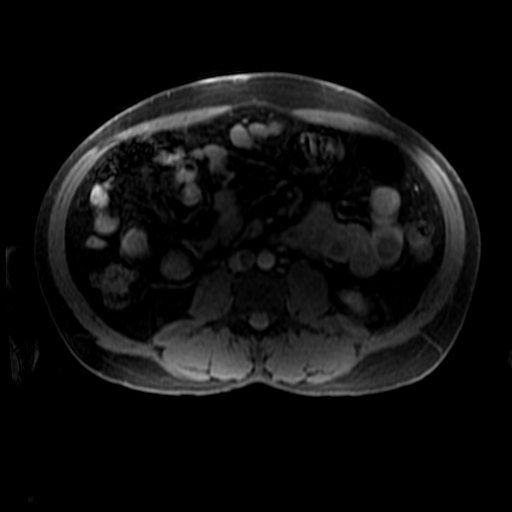
[im 58/116]
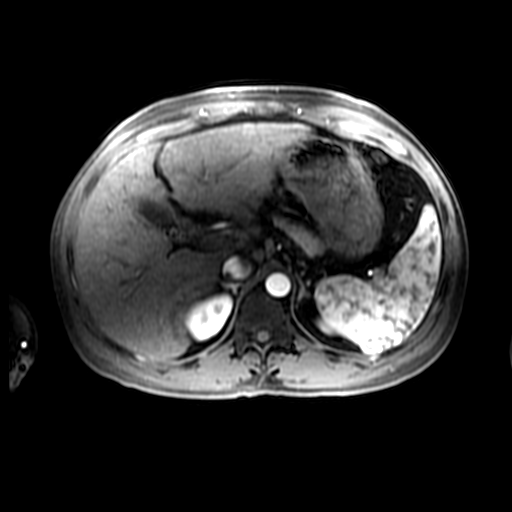
[im 116/116]
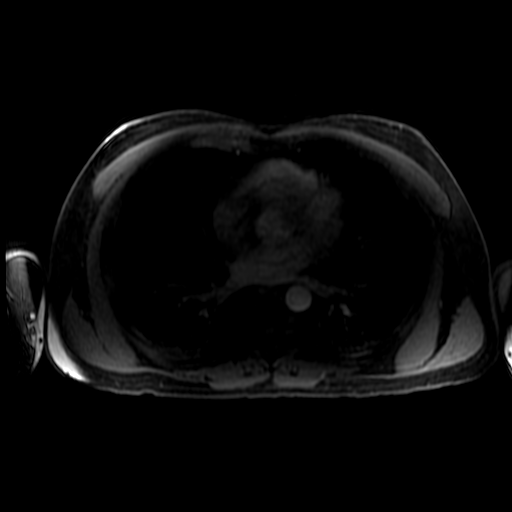

[Series 1002: T1 dynamic post-contrast · axial · non-contrast · 4.0mm · 0.86mm/px · z∈[-68,+162]mm · 3 of 116 slices shown (3 of 4)]
[im 1/116]
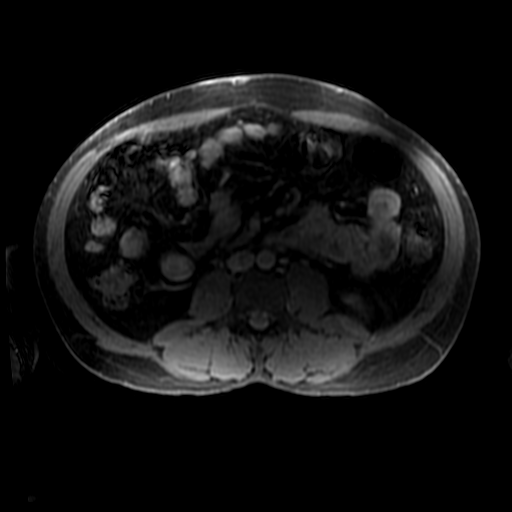
[im 58/116]
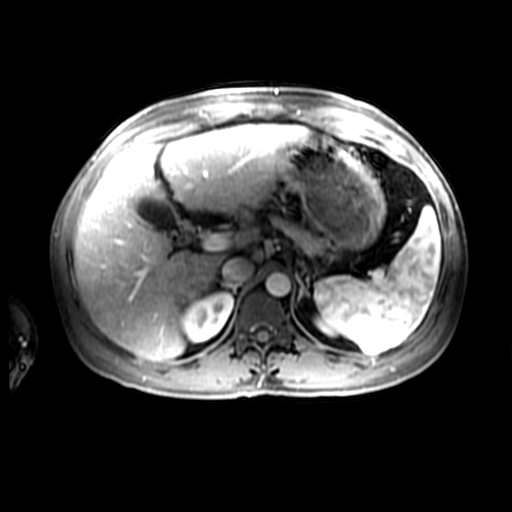
[im 116/116]
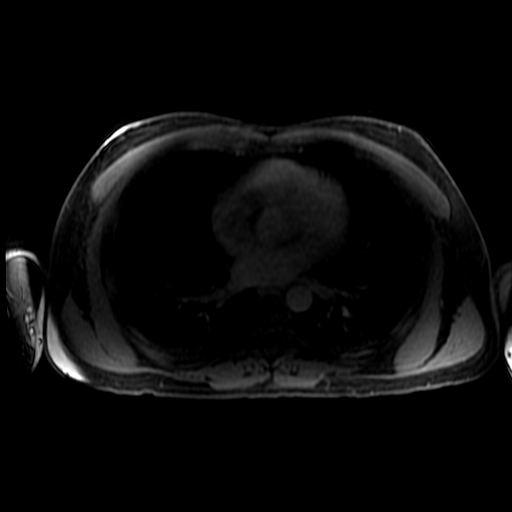

[Series 1003: T1 dynamic post-contrast · axial · non-contrast · 4.0mm · 0.86mm/px · z∈[-68,+46]mm · 2 of 116 slices shown (4 of 4)]
[im 1/116]
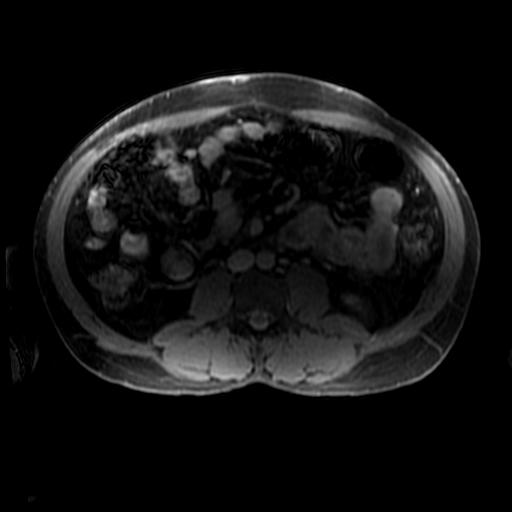
[im 58/116]
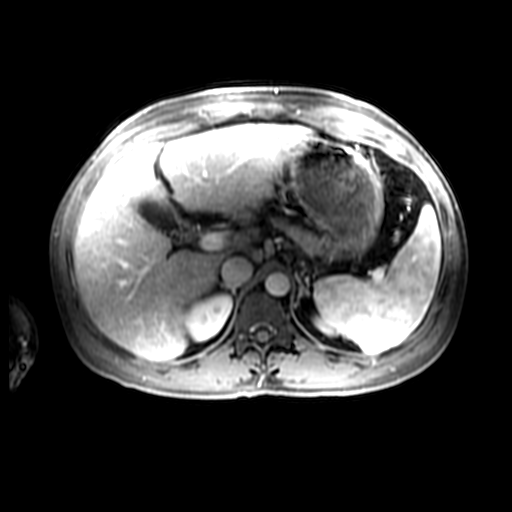

[23 of 48 positions shown; findings below may reference images not displayed]

FINDINGS: Lower chest: Hypoventilatory change in the dependent lungs.

Hepatobiliary: Cirrhotic hepatic morphology. No arterially enhancing
hepatic lesion. Cholelithiasis in a non distended gallbladder.
Gallbladder wall thickening without pericholecystic fluid is favored
to reflect sequela of chronic hepatocellular disease. No biliary
ductal dilation.

Pancreas: Intrinsic T1 signal of the pancreatic parenchyma is within
normal limits. Homogeneous enhancement pancreatic parenchyma
postcontrast administration. No cystic or solid hyperenhancing
pancreatic lesion identified. No pancreatic ductal dilation.

Spleen: Splenomegaly measuring 14.7 cm in maximum craniocaudal
dimension.

Adrenals/Urinary Tract: Bilateral adrenal glands appear normal. No
hydronephrosis. No solid enhancing renal mass.

Stomach/Bowel: Visualized portions within the abdomen are
unremarkable.

Vascular/Lymphatic: The portal, splenic and superior mesenteric
veins are patent. Normal caliber abdominal aorta. No pathologically
enlarged abdominal or pelvic lymph nodes.

Other:  No significant abdominal free fluid.

Musculoskeletal: No suspicious bone lesions identified.
IMPRESSION: 1. Cirrhosis with evidence of portal hypertension including
splenomegaly. No suspicious hepatic lesion.
2. Cholelithiasis. Gallbladder wall thickening is favored to reflect
sequela of chronic hepatocellular disease.
